# Patient Record
Sex: Male | Born: 1960 | Race: Black or African American | Hispanic: No | Marital: Single | Smoking: Current every day smoker
Health system: Southern US, Community
[De-identification: ages and names within clinical notes are randomized; demographics above are authoritative.]

## PROBLEM LIST (undated history)

## (undated) DIAGNOSIS — T754XXA Electrocution, initial encounter: Secondary | ICD-10-CM

---

## 2002-11-04 ENCOUNTER — Emergency Department (HOSPITAL_COMMUNITY): Admission: EM | Admit: 2002-11-04 | Discharge: 2002-11-04 | Payer: Self-pay | Admitting: Emergency Medicine

## 2014-01-18 ENCOUNTER — Emergency Department (HOSPITAL_COMMUNITY)
Admission: EM | Admit: 2014-01-18 | Discharge: 2014-01-18 | Disposition: A | Discharge status: Home / Self Care | Payer: BC Managed Care – PPO | Attending: Emergency Medicine | Admitting: Emergency Medicine

## 2014-01-18 ENCOUNTER — Encounter (HOSPITAL_COMMUNITY): Payer: Self-pay | Admitting: Emergency Medicine

## 2014-01-18 ENCOUNTER — Emergency Department (HOSPITAL_COMMUNITY): Payer: BC Managed Care – PPO

## 2014-01-18 DIAGNOSIS — R059 Cough, unspecified: Secondary | ICD-10-CM | POA: Diagnosis present

## 2014-01-18 DIAGNOSIS — R062 Wheezing: Secondary | ICD-10-CM | POA: Diagnosis not present

## 2014-01-18 DIAGNOSIS — R05 Cough: Secondary | ICD-10-CM | POA: Insufficient documentation

## 2014-01-18 DIAGNOSIS — F17200 Nicotine dependence, unspecified, uncomplicated: Secondary | ICD-10-CM | POA: Diagnosis not present

## 2014-01-18 DIAGNOSIS — R0602 Shortness of breath: Secondary | ICD-10-CM | POA: Diagnosis not present

## 2014-01-18 HISTORY — DX: Electrocution, initial encounter: T75.4XXA

## 2014-01-18 LAB — BASIC METABOLIC PANEL
ANION GAP: 18 — AB (ref 5–15)
BUN: 13 mg/dL (ref 6–23)
CO2: 20 mEq/L (ref 19–32)
Calcium: 9.1 mg/dL (ref 8.4–10.5)
Chloride: 101 mEq/L (ref 96–112)
Creatinine, Ser: 0.76 mg/dL (ref 0.50–1.35)
Glucose, Bld: 172 mg/dL — ABNORMAL HIGH (ref 70–99)
Potassium: 3.2 mEq/L — ABNORMAL LOW (ref 3.7–5.3)
SODIUM: 139 meq/L (ref 137–147)

## 2014-01-18 LAB — CBC WITH DIFFERENTIAL/PLATELET
BASOS PCT: 0 % (ref 0–1)
Basophils Absolute: 0 10*3/uL (ref 0.0–0.1)
Eosinophils Absolute: 0 10*3/uL (ref 0.0–0.7)
Eosinophils Relative: 0 % (ref 0–5)
HCT: 40.6 % (ref 39.0–52.0)
Hemoglobin: 13.2 g/dL (ref 13.0–17.0)
LYMPHS PCT: 6 % — AB (ref 12–46)
Lymphs Abs: 0.5 10*3/uL — ABNORMAL LOW (ref 0.7–4.0)
MCH: 28.6 pg (ref 26.0–34.0)
MCHC: 32.5 g/dL (ref 30.0–36.0)
MCV: 87.9 fL (ref 78.0–100.0)
Monocytes Absolute: 0.2 10*3/uL (ref 0.1–1.0)
Monocytes Relative: 2 % — ABNORMAL LOW (ref 3–12)
NEUTROS ABS: 7.4 10*3/uL (ref 1.7–7.7)
NEUTROS PCT: 92 % — AB (ref 43–77)
Platelets: 196 10*3/uL (ref 150–400)
RBC: 4.62 MIL/uL (ref 4.22–5.81)
RDW: 13.2 % (ref 11.5–15.5)
WBC: 8.1 10*3/uL (ref 4.0–10.5)

## 2014-01-18 LAB — TROPONIN I

## 2014-01-18 MED ORDER — ALBUTEROL SULFATE HFA 108 (90 BASE) MCG/ACT IN AERS: 1.0000 | INHALATION_SPRAY | Freq: Four times a day (QID) | RESPIRATORY_TRACT | Status: AC | PRN

## 2014-01-18 MED ORDER — DOXYCYCLINE HYCLATE 100 MG PO CAPS: 100.0000 mg | ORAL_CAPSULE | Freq: Two times a day (BID) | ORAL | Status: DC

## 2014-01-18 MED ORDER — ALBUTEROL (5 MG/ML) CONTINUOUS INHALATION SOLN
10.0000 mg/h | INHALATION_SOLUTION | Freq: Once | RESPIRATORY_TRACT | Status: AC
  Administered 2014-01-18: 10 mg/h via RESPIRATORY_TRACT
  Filled 2014-01-18: qty 20

## 2014-01-18 MED ORDER — PREDNISONE 50 MG PO TABS: ORAL_TABLET | ORAL | Status: DC

## 2014-01-18 MED ORDER — ALBUTEROL SULFATE HFA 108 (90 BASE) MCG/ACT IN AERS
2.0000 | INHALATION_SPRAY | Freq: Once | RESPIRATORY_TRACT | Status: AC
  Administered 2014-01-18: 2 via RESPIRATORY_TRACT
  Filled 2014-01-18: qty 6.7

## 2014-01-18 MED ORDER — PREDNISONE 20 MG PO TABS
60.0000 mg | ORAL_TABLET | Freq: Once | ORAL | Status: AC
  Administered 2014-01-18: 60 mg via ORAL
  Filled 2014-01-18: qty 3

## 2014-01-18 MED ORDER — ACETAMINOPHEN 325 MG PO TABS
650.0000 mg | ORAL_TABLET | Freq: Once | ORAL | Status: AC
  Administered 2014-01-18: 650 mg via ORAL
  Filled 2014-01-18: qty 2

## 2014-01-18 MED ORDER — ALBUTEROL SULFATE (2.5 MG/3ML) 0.083% IN NEBU
5.0000 mg | INHALATION_SOLUTION | Freq: Once | RESPIRATORY_TRACT | Status: AC
  Administered 2014-01-18: 5 mg via RESPIRATORY_TRACT
  Filled 2014-01-18: qty 6

## 2014-01-18 MED ORDER — SODIUM CHLORIDE 0.9 % IV BOLUS (SEPSIS)
500.0000 mL | Freq: Once | INTRAVENOUS | Status: AC
  Administered 2014-01-18: 500 mL via INTRAVENOUS

## 2014-01-18 NOTE — ED Notes (Signed)
Pt c/o nasal congestion and cough x 1 day.  States that he has a productive cough with yellow phlegm.  States that he can get in coughing spells which makes him SOB.  States that he feels congested in his chest also.
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
## 2014-02-10 ENCOUNTER — Emergency Department (HOSPITAL_COMMUNITY): Payer: BC Managed Care – PPO

## 2014-02-10 ENCOUNTER — Encounter (HOSPITAL_COMMUNITY): Payer: Self-pay | Admitting: Emergency Medicine

## 2014-02-10 ENCOUNTER — Emergency Department (HOSPITAL_COMMUNITY)
Admission: EM | Admit: 2014-02-10 | Discharge: 2014-02-10 | Disposition: A | Discharge status: Home / Self Care | Payer: BC Managed Care – PPO | Attending: Emergency Medicine | Admitting: Emergency Medicine

## 2014-02-10 DIAGNOSIS — S99929A Unspecified injury of unspecified foot, initial encounter: Secondary | ICD-10-CM

## 2014-02-10 DIAGNOSIS — S139XXA Sprain of joints and ligaments of unspecified parts of neck, initial encounter: Secondary | ICD-10-CM | POA: Diagnosis not present

## 2014-02-10 DIAGNOSIS — Y92410 Unspecified street and highway as the place of occurrence of the external cause: Secondary | ICD-10-CM | POA: Insufficient documentation

## 2014-02-10 DIAGNOSIS — S8990XA Unspecified injury of unspecified lower leg, initial encounter: Secondary | ICD-10-CM | POA: Insufficient documentation

## 2014-02-10 DIAGNOSIS — S335XXA Sprain of ligaments of lumbar spine, initial encounter: Secondary | ICD-10-CM | POA: Insufficient documentation

## 2014-02-10 DIAGNOSIS — S29019A Strain of muscle and tendon of unspecified wall of thorax, initial encounter: Secondary | ICD-10-CM

## 2014-02-10 DIAGNOSIS — S99919A Unspecified injury of unspecified ankle, initial encounter: Secondary | ICD-10-CM

## 2014-02-10 DIAGNOSIS — S8001XA Contusion of right knee, initial encounter: Secondary | ICD-10-CM

## 2014-02-10 DIAGNOSIS — S39012A Strain of muscle, fascia and tendon of lower back, initial encounter: Secondary | ICD-10-CM

## 2014-02-10 DIAGNOSIS — F17200 Nicotine dependence, unspecified, uncomplicated: Secondary | ICD-10-CM | POA: Insufficient documentation

## 2014-02-10 DIAGNOSIS — S239XXA Sprain of unspecified parts of thorax, initial encounter: Secondary | ICD-10-CM | POA: Insufficient documentation

## 2014-02-10 DIAGNOSIS — S8000XA Contusion of unspecified knee, initial encounter: Secondary | ICD-10-CM | POA: Diagnosis not present

## 2014-02-10 DIAGNOSIS — Y9389 Activity, other specified: Secondary | ICD-10-CM | POA: Insufficient documentation

## 2014-02-10 DIAGNOSIS — S161XXA Strain of muscle, fascia and tendon at neck level, initial encounter: Secondary | ICD-10-CM

## 2014-02-10 DIAGNOSIS — Z791 Long term (current) use of non-steroidal anti-inflammatories (NSAID): Secondary | ICD-10-CM | POA: Diagnosis not present

## 2014-02-10 MED ORDER — MELOXICAM 15 MG PO TABS: 15.0000 mg | ORAL_TABLET | Freq: Every day | ORAL | Status: AC

## 2014-02-10 MED ORDER — DIAZEPAM 5 MG PO TABS: 5.0000 mg | ORAL_TABLET | Freq: Four times a day (QID) | ORAL | Status: AC | PRN

## 2014-02-10 MED ORDER — OXYCODONE-ACETAMINOPHEN 5-325 MG PO TABS
2.0000 | ORAL_TABLET | Freq: Once | ORAL | Status: AC
  Administered 2014-02-10: 2 via ORAL
  Filled 2014-02-10: qty 2

## 2014-02-10 MED ORDER — HYDROCODONE-ACETAMINOPHEN 5-325 MG PO TABS: 1.0000 | ORAL_TABLET | ORAL | Status: AC | PRN
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
## 2014-02-10 NOTE — ED Notes (Signed)
Patient transported to X-ray 

## 2014-02-10 NOTE — ED Notes (Signed)
Patient transported to CT 
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
## 2014-02-10 NOTE — Discharge Instructions (Signed)
1. Medications: robaxin, mobic (with food), vicodin, usual home medications 2. Treatment: rest, drink plenty of fluids, gentle stretching as discussed, alternate ice and heat 3. Follow Up: Please followup with your primary doctor in 2 days for discussion of your diagnoses and further evaluation after today's visit; if you do not have a primary care doctor use the resource guide provided to find one; return to the emergency department for loss of bowel or bladder control, worsening pain, numbness, difficulty walking or other concerning symptoms   Back Exercises Back exercises help treat and prevent back injuries. The goal of back exercises is to increase the strength of your abdominal and back muscles and the flexibility of your back. These exercises should be started when you no longer have back pain. Back exercises include:  Pelvic Tilt. Lie on your back with your knees bent. Tilt your pelvis until the lower part of your back is against the floor. Hold this position 5 to 10 sec and repeat 5 to 10 times.  Knee to Chest. Pull first 1 knee up against your chest and hold for 20 to 30 seconds, repeat this with the other knee, and then both knees. This may be done with the other leg straight or bent, whichever feels better.  Sit-Ups or Curl-Ups. Bend your knees 90 degrees. Start with tilting your pelvis, and do a partial, slow sit-up, lifting your trunk only 30 to 45 degrees off the floor. Take at least 2 to 3 seconds for each sit-up. Do not do sit-ups with your knees out straight. If partial sit-ups are difficult, simply do the above but with only tightening your abdominal muscles and holding it as directed.  Hip-Lift. Lie on your back with your knees flexed 90 degrees. Push down with your feet and shoulders as you raise your hips a couple inches off the floor; hold for 10 seconds, repeat 5 to 10 times.  Back arches. Lie on your stomach, propping yourself up on bent elbows. Slowly press on your hands,  causing an arch in your low back. Repeat 3 to 5 times. Any initial stiffness and discomfort should lessen with repetition over time.  Shoulder-Lifts. Lie face down with arms beside your body. Keep hips and torso pressed to floor as you slowly lift your head and shoulders off the floor. Do not overdo your exercises, especially in the beginning. Exercises may cause you some mild back discomfort which lasts for a few minutes; however, if the pain is more severe, or lasts for more than 15 minutes, do not continue exercises until you see your caregiver. Improvement with exercise therapy for back problems is slow.  See your caregivers for assistance with developing a proper back exercise program. Document Released: 06/23/2004 Document Revised: 08/08/2011 Document Reviewed: 03/17/2011 [facility] Patient Information 2015 [place], [place]. This information is not intended to replace advice given to you by your health care provider. Make sure you discuss any questions you have with your health care provider.   Emergency Department Resource Guide 1) Find a Doctor and Pay Out of Pocket Although you won't have to find out who is covered by your insurance plan, it is a good idea to ask around and get recommendations. You will then need to call the office and see if the doctor you have chosen will accept you as a new patient and what types of options they offer for patients who are self-pay. Some doctors offer discounts or will set up payment plans for their patients who do not have  insurance, but you will need to ask so you aren't surprised when you get to your appointment.  2) Contact Your Local Health Department Not all health departments have doctors that can see patients for sick visits, but many do, so it is worth a call to see if yours does. If you don't know where your local health department is, you can check in your phone book. The CDC also has a tool to help you locate your state's health department, and many  state websites also have listings of all of their local health departments.  3) Find a Walk-in Clinic If your illness is not likely to be very severe or complicated, you may want to try a walk in clinic. These are popping up all over the country in pharmacies, drugstores, and shopping centers. They're usually staffed by nurse practitioners or physician assistants that have been trained to treat common illnesses and complaints. They're usually fairly quick and inexpensive. However, if you have serious medical issues or chronic medical problems, these are probably not your best option.  No Primary Care Doctor: - Call Health Connect at  [phone] - they can help you locate a primary care doctor that  accepts your insurance, provides certain services, etc. - Physician Referral Service- [phone]  Chronic Pain Problems: Organization         Address  Phone   Notes  [facility]  [phone] Patients need to be referred by their primary care doctor.   Medication Assistance: Organization         Address  Phone   Notes  [facility] Medication [facility] [address] [place] [place], [place] [address] [phone] --Must be a resident of [facility] -- Must have NO insurance coverage whatsoever (no Medicaid/ Medicare, etc.) -- The pt. MUST have a primary care doctor that directs their care regularly and follows them in the community   [facility]  [phone]   [employer]  [phone]    Agencies that provide inexpensive medical care: Organization         Address  Phone   Notes  [facility]  [phone]   [facility]    [phone]   [facility] [address] [place], [place] [address] [phone]   [facility] [address] [place], [place] [phone]   [facility]    [phone]   [facility]    [phone]   [facility]  [address], [place] Phone:  [phone], Fax:  [phone] Hours of Operation:  9 am - 6 pm, M-F.  Also accepts Medicaid/Medicare and self-pay.  [facility]  [address], [place] Phone: [phone], Fax: [phone]. Hours of Operation:  8:30 am - 5:30 pm, M-F.  Also accepts Medicaid and self-pay.  [facility] [place] [address] [place] Phone: [phone]   [facility] [address] Natasha Bence [place], [place] [phone], Ext. [phone] Mondays & Thursdays: 7-9 AM.  First 15 patients are seen on a first come, first serve basis.    Medicaid-accepting [facility] Providers:  Organization         Address  Phone   Notes  [facility] [address], [place] [phone] Also accepts self-pay patients.  [facility] [address]  Laurell Josephs [place], [place]  [phone]   [facility] [address], [place] [phone]   [facility] [address] [place] [phone]   Renaye Rakers [address], [place]   [phone] Only accepts [place] Access [place] patients after they have their name applied to their card.   Self-Pay (no insurance) in [facility]:  Organization         Address  Phone   Notes  Sickle Cell Patients, [facility] Medicine [address] [place], [place] [phone]   [facility] [address] [place], [place] [phone]   [facility]  [address], [place] [phone]   [facility]/Dr. Osei-Bonsu  [address] [place] or [address], [place] [phone] Phone number for both [place] and [place] locations is the same.  [facility] [address], [place] [phone]   [facility] [address] [place] or [address] [phone]   [facility] [address] [place] [phone], phone; [phone], fax Sees patients 1st and 3rd Saturday of every month.  Must not qualify for public or private insurance (i.e. Medicaid, Medicare, [place] Health Choice, Veterans' Benefits)  Household income should be no more than 200% of the poverty level The clinic cannot treat you if you are pregnant or think you are pregnant  Sexually transmitted diseases are not treated at the clinic.    Dental Care: Organization         Address  Phone  Notes  [facility] [address] [place], [place] [phone] Accepts children up to age 67 who are enrolled in [place] or [place] Health Choice; pregnant women with a Medicaid card; and children who have applied for Medicaid or [place] Health Choice, but were declined, whose parents can pay a reduced fee at time of service.  [facility]  [address], [place] [phone] Accepts children up to age 68 who are enrolled in [place] or [place] Health Choice; pregnant women with a Medicaid card; and children who have applied for Medicaid or  Health Choice, but were declined, whose parents can pay a reduced fee at time of service.  [facility]  [address] [place], [place] [phone] Patients are seen by appointment only. Walk-ins are not accepted. [facility] will see patients 73 years of age and older. Monday - Tuesday (8am-5pm) Most Wednesdays (8:30-5pm) $30 per visit, cash only  [facility] Adult Dental Access PRO[facility]  [address], [facility] [phone] Patients are seen by appointment only. Walk-ins are not accepted. [facility] will see patients 31 years of age and older. One Wednesday Evening (Monthly: Volunteer Based).  $30 per visit, cash only  [employer]  [phone] for adults; Children under age 24, call Graduate Pediatric Dentistry at [phone]. Children aged 81-14, please call [phone] to request a pediatric application.  Dental services are provided in all areas of dental care including fillings, crowns and bridges, complete and partial dentures, implants, gum treatment, root canals, and extractions. Preventive care is also provided. Treatment is provided to both adults and children. Patients are selected via  a lottery and there is often a waiting list.   [facility] [address] [place]  [phone] [email or web address]   [facility] [address] [place], [place] [phone], Ext. [phone] Second and Fourth Thursday of each month, opens at 6:30 AM; Clinic ends at 9 AM.  Patients are seen on a first-come first-served basis, and a limited number are seen during each clinic.   [facility]  [address] Ether Griffins [place], [place] [phone]   Eligibility Requirements You must have lived in [place], [place], or [place] counties for at least the last three months.   You cannot be eligible for state or federal sponsored [place], including [employer], [place], or [employer].   You generally cannot be eligible for healthcare insurance through your employer.    How to apply: Eligibility screenings are held every Tuesday and Wednesday afternoon from 1:00 pm until 4:00 pm. You do not need an appointment for the interview!  [facility] [address], [place], [place] [phone]   [facility] Health Department  [phone]   [facility] Health Department  [phone]   [facility] Health Department  [phone]    Behavioral Health Resources in the Community: Intensive Outpatient Programs Organization         Address  Phone  Notes  [facility] [address], [place], [place]  [phone]   [facility] Outpatient [address], [place], [place] [phone]   [facility] [address], [place], [place]  [phone]   [facility] Health [address],  [place], [place] [phone] or [phone]   Substance Abuse Resources Organization         Address  Phone  Notes  [facility]  [phone]   [facility]  [phone]   [facility] [place]  [phone]   Floydene Flock  [phone]   Residential & Outpatient Substance Abuse Program  [phone]   Psychological Services Organization         Address  Phone  Notes  [facility] Behavioral Health  [phone]   [facility]  [phone]   [facility] Mental Health [address] [place] [phone] or [phone]    Mobile Crisis Teams Organization         Address  Phone  Notes  [facility], Mobile Crisis Care Unit  [phone]   [facility]  [address] [place], [place] [phone]   Doristine Locks [address] [place] [phone]    Self-Help/Support Groups Organization         Address  Phone             Notes  [facility] [place] - variety of support groups  336- I7437963 Call for more information  Narcotics Anonymous (NA), [facility] [address], [employer] [place]  2 meetings at this location   Statistician         Address  Phone  Notes  [facility] [address],    [place]  [phone]   [facility]  [address], [place] [address], [place], [place] [phone]   [facility] [address] [place], [place] [phone] Admissions: 8am-3pm M-F  [facility] [address]    [place], [place] [phone]   [facility] [address],  [place], [place] [phone]   [facility] [address],    [place], [place] [phone]   [facility] - Intensive Outpatient [address] Laurell Josephs [address], [place], [place] [phone]   [facility] ([facility] [address] [place]  [place], [place] [phone] or [phone]   [facility] ([facility]) [address], [place], [place] [phone] Accepts Medicaid  [facility] [address],  [place] [phone] Substance Abuse/Addiction Treatment   [facility] Organization         Address  Phone  Notes  [facility]  [phone]   Angie Fava, PhD [address] Ervin Knack [place], [place]   [phone] or [phone]   [facility] Behavioral   [address] [place], [place] [phone]   [facility] [address] [place], [place] [phone] Insurance/Medicaid/sponsorship through [facility] [address]                                    [place], [place] [phone] Therapy/tele-psych/case  [facility] [place] [facility] [address], [place] [phone]    Dr. Lolly Mustache  [phone]   [facility]  [employer] [facility] Dept. 1) [address] [place] 2) [address] [place] 3)  [address], [place] [phone]  [phone]   [facility] Child Abuse Hotline [phone] or 2285376129 (After Hours)

## 2014-02-14 NOTE — ED Provider Notes (Signed)
Medical screening examination/treatment/procedure(s) were performed by non-physician practitioner and as supervising physician I was immediately available for consultation/collaboration.   EKG Interpretation None        Candyce Churn III, MD 02/14/14 1530
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
## 2019-01-11 ENCOUNTER — Other Ambulatory Visit: Payer: Self-pay

## 2019-01-11 ENCOUNTER — Other Ambulatory Visit: Payer: Self-pay | Admitting: *Deleted

## 2019-01-11 DIAGNOSIS — Z20822 Contact with and (suspected) exposure to covid-19: Secondary | ICD-10-CM

## 2019-01-13 LAB — NOVEL CORONAVIRUS, NAA: SARS-CoV-2, NAA: NOT DETECTED
